# Patient Record
Sex: Female | Born: 2005 | Race: White | Hispanic: No | Marital: Single | Smoking: Never smoker
Health system: Southern US, Community
[De-identification: ages and names within clinical notes are randomized; demographics above are authoritative.]

## PROBLEM LIST (undated history)

## (undated) DIAGNOSIS — J45909 Unspecified asthma, uncomplicated: Secondary | ICD-10-CM

## (undated) DIAGNOSIS — F909 Attention-deficit hyperactivity disorder, unspecified type: Secondary | ICD-10-CM

## (undated) DIAGNOSIS — J302 Other seasonal allergic rhinitis: Secondary | ICD-10-CM

---
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
## 2023-01-23 ENCOUNTER — Ambulatory Visit
Admission: EM | Admit: 2023-01-23 | Discharge: 2023-01-23 | Disposition: A | Discharge status: Home / Self Care | Payer: Medicaid Other

## 2023-01-23 DIAGNOSIS — J069 Acute upper respiratory infection, unspecified: Secondary | ICD-10-CM | POA: Diagnosis not present

## 2023-01-23 HISTORY — DX: Unspecified asthma, uncomplicated: J45.909

## 2023-01-23 MED ORDER — IPRATROPIUM BROMIDE 0.06 % NA SOLN: 2.0000 | Freq: Four times a day (QID) | NASAL | 12 refills | Status: DC

## 2023-01-23 MED ORDER — PROMETHAZINE-DM 6.25-15 MG/5ML PO SYRP: 5.0000 mL | ORAL_SOLUTION | Freq: Four times a day (QID) | ORAL | 0 refills | Status: DC | PRN

## 2023-01-23 MED ORDER — BENZONATATE 100 MG PO CAPS: 200.0000 mg | ORAL_CAPSULE | Freq: Three times a day (TID) | ORAL | 0 refills | Status: DC

## 2023-01-23 NOTE — ED Provider Notes (Signed)
[facility]    CSN: [id number] Arrival date & time: 01/23/23  1341      History   Chief Complaint Chief Complaint  Patient presents with   Cough   Nasal Congestion    HPI Kristina Carroll is a 17 y.o. female.   HPI  18 year old female here for evaluation of respiratory complaints.  Patient has a past medical history of asthma, seasonal allergies, and ADHD presenting for 2-day history of nasal congestion and a cough this been productive for green sputum.  This is not associated with any fever, sore throat, ear pain, shortness of breath, wheezing, or GI complaints.  Past Medical History:  Diagnosis Date   ADHD    Asthma    Seasonal allergies     There are no problems to display for this patient.   History reviewed. No pertinent surgical history.  OB History   No obstetric history on file.      Home Medications    Prior to Admission medications   Medication Sig Start Date End Date Taking? Authorizing Provider  benzonatate (TESSALON) 100 MG capsule Take 2 capsules (200 mg total) by mouth every 8 (eight) hours. 01/23/23  Yes Margarette Canada, NP  cetirizine (ZYRTEC) 10 MG tablet Take 1 tablet by mouth daily. 08/18/22  Yes [provider]  ESTARYLLA 0.25-35 MG-MCG tablet Take 1 tablet by mouth daily.   Yes [provider]  ipratropium (ATROVENT) 0.06 % nasal spray Place 2 sprays into both nostrils 4 (four) times daily. 01/23/23  Yes Margarette Canada, NP  montelukast (SINGULAIR) 10 MG tablet Take by mouth. 09/24/22 09/24/23 Yes [provider]  promethazine-dextromethorphan (PROMETHAZINE-DM) 6.25-15 MG/5ML syrup Take 5 mLs by mouth 4 (four) times daily as needed. 01/23/23  Yes Margarette Canada, NP  VENTOLIN HFA 108 (90 Base) MCG/ACT inhaler Inhale into the lungs.   Yes [provider]    Family History History reviewed. No pertinent family history.  Social History Social History   Tobacco Use   Smoking status: Never   Smokeless  tobacco: Never  Vaping Use   Vaping Use: Never used  Substance Use Topics   Alcohol use: Never   Drug use: Never     Allergies   Suprax [cefixime]   Review of Systems Review of Systems  Constitutional:  Negative for fever.  HENT:  Positive for congestion. Negative for ear pain and sore throat.   Respiratory:  Positive for cough. Negative for shortness of breath and wheezing.   Gastrointestinal:  Negative for diarrhea, nausea and vomiting.     Physical Exam Triage Vital Signs ED Triage Vitals  Enc Vitals Group     BP 01/23/23 1352 125/84     Pulse Rate 01/23/23 1352 90     Resp 01/23/23 1352 16     Temp 01/23/23 1352 98.2 F (36.8 C)     Temp Source 01/23/23 1352 Oral     SpO2 01/23/23 1352 97 %     Weight 01/23/23 1349 95 lb (43.1 kg)     Height 01/23/23 1349 '4\' 9"'$  (1.448 m)     Head Circumference --      Peak Flow --      Pain Score 01/23/23 1349 0     Pain Loc --      Pain Edu? --      Excl. in [place]? --    No data found.  Updated Vital Signs BP 125/84 (BP Location: Left Arm)   Pulse 90   Temp  98.2 F (36.8 C) (Oral)   Resp 16   Ht '4\' 9"'$  (1.448 m)   Wt 95 lb (43.1 kg)   SpO2 97%   BMI 20.56 kg/m   Visual Acuity Right Eye Distance:   Left Eye Distance:   Bilateral Distance:    Right Eye Near:   Left Eye Near:    Bilateral Near:     Physical Exam Vitals and nursing note reviewed.  Constitutional:      Appearance: Normal appearance. She is not ill-appearing.  HENT:     Head: Normocephalic and atraumatic.     Right Ear: Tympanic membrane, ear canal and external ear normal. There is no impacted cerumen.     Left Ear: Tympanic membrane, ear canal and external ear normal. There is no impacted cerumen.     Nose: Congestion and rhinorrhea present.     Comments: Nasal mucosa is erythematous and edematous with scant clear discharge in both nares.    Mouth/Throat:     Mouth: Mucous membranes are moist.     Pharynx: Oropharynx is clear. Posterior  oropharyngeal erythema present. No oropharyngeal exudate.     Comments: Mild erythema without injection to the posterior oropharynx with clear postnasal drip. Cardiovascular:     Rate and Rhythm: Normal rate and regular rhythm.     Pulses: Normal pulses.     Heart sounds: Normal heart sounds. No murmur heard.    No friction rub. No gallop.  Pulmonary:     Effort: Pulmonary effort is normal.     Breath sounds: Normal breath sounds. No wheezing, rhonchi or rales.  Musculoskeletal:     Cervical back: Normal range of motion and neck supple.  Lymphadenopathy:     Cervical: No cervical adenopathy.  Skin:    General: Skin is warm and dry.     Capillary Refill: Capillary refill takes less than 2 seconds.     Findings: No rash.  Neurological:     Mental Status: She is alert.      UC Treatments / Results  Labs (all labs ordered are listed, but only abnormal results are displayed) Labs Reviewed - No data to display  EKG   Radiology No results found.  Procedures Procedures (including critical care time)  Medications Ordered in UC Medications - No data to display  Initial Impression / Assessment and Plan / UC Course  I have reviewed the triage vital signs and the nursing notes.  Pertinent labs & imaging results that were available during my care of the patient were reviewed by me and considered in my medical decision making (see chart for details).   Patient is a pleasant, nontoxic-appearing 17 year old female here with her mother for evaluation of 2-day history of respiratory complaints as outlined HPI above.  She does have a history of asthma but she is able to speak in full sentences without dyspnea or tachypnea and she has not been experiencing any shortness breath or wheezing at home.  She does have a home nebulizer but states that she has not had to use it.  She is 97% on room air and her respiratory rate is 16 here in clinic.  She is also afebrile with oral temp of 98.2.  She  does have inflammation of her upper respiratory tract on exam but her lungs are clear to auscultation all fields.  Her exam is consistent with a viral URI.  Given that she has not had a fever I do not suspect influenza.  It may be  COVID but given that she has not had a fever she does not need to quarantine and I have advised her to wear a mask around others for an additional 3 days as her symptoms have been going on for the past 2.  I will prescribe Atrovent nasal spray to help with the nasal congestion along with Tessalon Perles and Promethazine DM cough syrup for cough and congestion.  She has a nebulizer at home and I have encouraged her to use it every 4-6 hours as needed for shortness of breath, wheezing, or coughing as this can be a sign of bronchospasm.   Final Clinical Impressions(s) / UC Diagnoses   Final diagnoses:  Viral URI with cough     Discharge Instructions      Use the Atrovent nasal spray, 2 squirts in each nostril every 6 hours, as needed for runny nose and postnasal drip.  Use the Tessalon Perles every 8 hours during the day.  Take them with a small sip of water.  They may give you some numbness to the base of your tongue or a metallic taste in your mouth, this is normal.  Use the Promethazine DM cough syrup at bedtime for cough and congestion.  It will make you drowsy so do not take it during the day.  If you feel short of breath, wheeze, or cough use your nebulizer. You can use it every 4-6 hours.  Return for reevaluation or see your primary care provider for any new or worsening symptoms.      ED Prescriptions     Medication Sig Dispense Auth. Provider   benzonatate (TESSALON) 100 MG capsule Take 2 capsules (200 mg total) by mouth every 8 (eight) hours. 21 capsule Margarette Canada, NP   ipratropium (ATROVENT) 0.06 % nasal spray Place 2 sprays into both nostrils 4 (four) times daily. 15 mL Margarette Canada, NP   promethazine-dextromethorphan (PROMETHAZINE-DM) 6.25-15  MG/5ML syrup Take 5 mLs by mouth 4 (four) times daily as needed. 118 mL Margarette Canada, NP      PDMP not reviewed this encounter.   Margarette Canada, NP 01/23/23 1414

## 2023-01-23 NOTE — Discharge Instructions (Signed)
Use the Atrovent nasal spray, 2 squirts in each nostril every 6 hours, as needed for runny nose and postnasal drip.  Use the Tessalon Perles every 8 hours during the day.  Take them with a small sip of water.  They may give you some numbness to the base of your tongue or a metallic taste in your mouth, this is normal.  Use the Promethazine DM cough syrup at bedtime for cough and congestion.  It will make you drowsy so do not take it during the day.  If you feel short of breath, wheeze, or cough use your nebulizer. You can use it every 4-6 hours.  Return for reevaluation or see your primary care provider for any new or worsening symptoms.

## 2023-01-23 NOTE — ED Triage Notes (Signed)
Pt c/o cough & nasal congestion x2 days. Denies any fevers,chills or bodyaches.  

## 2023-12-10 ENCOUNTER — Ambulatory Visit (INDEPENDENT_AMBULATORY_CARE_PROVIDER_SITE_OTHER): Payer: Medicaid Other

## 2023-12-10 ENCOUNTER — Ambulatory Visit
Admission: EM | Admit: 2023-12-10 | Discharge: 2023-12-10 | Disposition: A | Discharge status: Home / Self Care | Payer: Medicaid Other

## 2023-12-10 DIAGNOSIS — R0602 Shortness of breath: Secondary | ICD-10-CM | POA: Diagnosis not present

## 2023-12-10 DIAGNOSIS — J069 Acute upper respiratory infection, unspecified: Secondary | ICD-10-CM | POA: Diagnosis not present

## 2023-12-10 MED ORDER — PROMETHAZINE-DM 6.25-15 MG/5ML PO SYRP: 5.0000 mL | ORAL_SOLUTION | Freq: Four times a day (QID) | ORAL | 0 refills | Status: AC | PRN

## 2023-12-10 NOTE — ED Provider Notes (Signed)
[facility]    CSN: [id number] Arrival date & time: 12/10/23  1708      History   Chief Complaint Chief Complaint  Patient presents with   Cough   Shortness of Breath    HPI Kristina Carroll is a 18 y.o. female with intellectual disability, chromosomal abnormality, seizure disorder, ADHD, asthma and allergies.  Presents with her mother today who helps provide the history.  Patient has been ill for the past 1 week with cough, congestion and shortness of breath.  Mother was diagnosed with pneumonia recently.  Concerned her daughter may have pneumonia as well.  Giving TheraFlu and has helped somewhat.  HPI  Past Medical History:  Diagnosis Date   ADHD    Asthma    Seasonal allergies     There are no active problems to display for this patient.   History reviewed. No pertinent surgical history.  OB History   No obstetric history on file.      Home Medications    Prior to Admission medications   Medication Sig Start Date End Date Taking? Authorizing Provider  cetirizine (ZYRTEC) 10 MG tablet Take 1 tablet by mouth daily. 08/18/22  Yes [provider]  ESTARYLLA 0.25-35 MG-MCG tablet Take 1 tablet by mouth daily.   Yes [provider]  Lacosamide 100 MG TABS Take by mouth.   Yes [provider]  levETIRAcetam (KEPPRA) 500 MG tablet Take by mouth. 10/13/23 10/12/24 Yes [provider]  promethazine-dextromethorphan (PROMETHAZINE-DM) 6.25-15 MG/5ML syrup Take 5 mLs by mouth 4 (four) times daily as needed. 12/10/23  Yes Shirlee Latch, PA-C  VENTOLIN HFA 108 (90 Base) MCG/ACT inhaler Inhale into the lungs.   Yes [provider]  montelukast (SINGULAIR) 10 MG tablet Take by mouth. 09/24/22 09/24/23  [provider]    Family History History reviewed. No pertinent family history.  Social History Social History   Tobacco Use   Smoking status: Never   Smokeless tobacco: Never  Vaping Use   Vaping status:  Never Used  Substance Use Topics   Alcohol use: Never   Drug use: Never     Allergies   Suprax [cefixime]   Review of Systems Review of Systems  Constitutional:  Positive for fatigue. Negative for chills, diaphoresis and fever.  HENT:  Positive for congestion and rhinorrhea. Negative for ear pain, sinus pressure, sinus pain and sore throat.   Respiratory:  Positive for cough and shortness of breath.   Gastrointestinal:  Negative for abdominal pain, nausea and vomiting.  Musculoskeletal:  Negative for arthralgias and myalgias.  Skin:  Negative for rash.  Neurological:  Negative for weakness and headaches.  Hematological:  Negative for adenopathy.     Physical Exam Triage Vital Signs ED Triage Vitals  Encounter Vitals Group     BP      Systolic BP Percentile      Diastolic BP Percentile      Pulse      Resp      Temp      Temp src      SpO2      Weight      Height      Head Circumference      Peak Flow      Pain Score      Pain Loc      Pain Education      Exclude from Growth Chart    No data found.  Updated Vital Signs BP 135/75 (BP  Location: Left Arm)   Pulse 91   Temp 98.7 F (37.1 C) (Oral)   Ht 4\' 9"  (1.448 m)   LMP 12/10/2023   SpO2 100%   BMI 20.56 kg/m   Physical Exam Vitals and nursing note reviewed.  Constitutional:      General: She is not in acute distress.    Appearance: Normal appearance. She is not ill-appearing or toxic-appearing.  HENT:     Head: Normocephalic and atraumatic.     Right Ear: Tympanic membrane, ear canal and external ear normal.     Left Ear: Tympanic membrane, ear canal and external ear normal.     Nose: Congestion present.     Mouth/Throat:     Mouth: Mucous membranes are moist.     Pharynx: Oropharynx is clear.  Eyes:     General: No scleral icterus.       Right eye: No discharge.        Left eye: No discharge.     Conjunctiva/sclera: Conjunctivae normal.  Cardiovascular:     Rate and Rhythm: Normal rate and  regular rhythm.     Heart sounds: Normal heart sounds.  Pulmonary:     Effort: Pulmonary effort is normal. No respiratory distress.     Breath sounds: Normal breath sounds.  Musculoskeletal:     Cervical back: Neck supple.  Skin:    General: Skin is dry.  Neurological:     General: No focal deficit present.     Mental Status: She is alert. Mental status is at baseline.     Motor: No weakness.     Gait: Gait normal.  Psychiatric:        Mood and Affect: Mood normal.        Behavior: Behavior normal.      UC Treatments / Results  Labs (all labs ordered are listed, but only abnormal results are displayed) Labs Reviewed - No data to display  EKG   Radiology DG Chest 2 View Result Date: 12/10/2023 CLINICAL DATA:  Cough with shortness of breath for 1 week. EXAM: CHEST - 2 VIEW COMPARISON:  None Available. FINDINGS: The heart size and mediastinal contours are normal. The lungs are clear. There is no pleural effusion or pneumothorax. No acute osseous findings are identified. Patient is status post multilevel thoracolumbar rod and screw fusion with a mild convex right scoliosis. IMPRESSION: No evidence of acute cardiopulmonary process. Electronically Signed   By: Carey Bullocks M.D.   On: 12/10/2023 18:01    Procedures Procedures (including critical care time)  Medications Ordered in UC Medications - No data to display  Initial Impression / Assessment and Plan / UC Course  I have reviewed the triage vital signs and the nursing notes.  Pertinent labs & imaging results that were available during my care of the patient were reviewed by me and considered in my medical decision making (see chart for details).   18 year old female with intellectual disability presents with mother for 1 week history of cough, congestion and shortness of breath.  Has history of asthma.  Has been using albuterol as needed.  Mother diagnosed with pneumonia recently.  Vitals are normal and stable the  patient is overall well-appearing.  Slight nasal congestion.  Throat clear.  No evidence of ear infection.  Chest clear to auscultation and heart regular rate and rhythm.  Chest x-ray performed.  Normal x-ray.  Reviewed results with patient and parent.  Viral URI.  Doubt asthma exacerbation.  Advised to continue  TheraFlu.  Promethazine DM at bedtime as needed.  Advise giving half the dose if the full dose is too strong.  Encouraged increased rest and fluids and using albuterol as needed.  If breathing worsens should return.  If fever or not improving over the next week should return for reevaluation.   Final Clinical Impressions(s) / UC Diagnoses   Final diagnoses:  Shortness of breath  Viral URI with cough     Discharge Instructions      X-ray was normal.  URI/COLD SYMPTOMS: Your exam today is consistent with a viral illness. Antibiotics are not indicated at this time. Use medications as directed, including cough syrup, nasal saline, and decongestants. Your symptoms should improve over the next few days and resolve within another 7-10 days. Increase rest and fluids. F/u if symptoms worsen or predominate such as sore throat, ear pain, productive cough, shortness of breath, or if you develop high fevers or worsening fatigue over the next several days.       ED Prescriptions     Medication Sig Dispense Auth. Provider   promethazine-dextromethorphan (PROMETHAZINE-DM) 6.25-15 MG/5ML syrup Take 5 mLs by mouth 4 (four) times daily as needed. 118 mL Shirlee Latch, PA-C      PDMP not reviewed this encounter.   Shirlee Latch, PA-C 12/10/23 1818

## 2023-12-10 NOTE — ED Triage Notes (Signed)
Pt is with her mom  Pt is special needs  Pt mother states that she has pneumonia.  Pt c/o cough, SOB, x1week  Pt has a history of asthma.   Pt has a history of seizures.

## 2023-12-10 NOTE — Discharge Instructions (Addendum)
X-ray was normal.  URI/COLD SYMPTOMS: Your exam today is consistent with a viral illness. Antibiotics are not indicated at this time. Use medications as directed, including cough syrup, nasal saline, and decongestants. Your symptoms should improve over the next few days and resolve within another 7-10 days. Increase rest and fluids. F/u if symptoms worsen or predominate such as sore throat, ear pain, productive cough, shortness of breath, or if you develop high fevers or worsening fatigue over the next several days.
